# Patient Record
Sex: Male | Born: 1965 | Race: White | Hispanic: No | Marital: Married
Health system: Southern US, Community
[De-identification: ages and names within clinical notes are randomized; demographics above are authoritative.]

---

## 2011-07-05 ENCOUNTER — Emergency Department: Payer: Self-pay | Admitting: Emergency Medicine

## 2011-07-17 DEATH — deceased

## 2011-12-23 IMAGING — CT CT ABD-PELV W/ CM
1 of 3 series · 12 of 32 positions shown, 18 images · IV contrast (APPLIED)
Comparison: none

REASON FOR EXAM: (1) IV CONTRAST ONLY; GO W/O LABS; (2) UMBILICAL & BACK
PAIN, HYPOTENSIVE, EVAL
COMMENTS:   May transport without cardiac monitor

PROCEDURE:     CT  - CT ABDOMEN / PELVIS  W  - July 05, 2011 [DATE]
RESULT:
TECHNIQUE: Emergent CT of the abdomen and pelvis is reconstructed at 3 mm
slice thickness. The study is performed with 100 ml of Wsovue-WB1 iodinated
intravenous contrast. No oral contrast was administered.

[Series 4: aaa · axial · 0.79mm/px · z∈[+155,+605]mm · 12 of 176 slices shown, 18 images]
[im 13/176  soft-tissue]
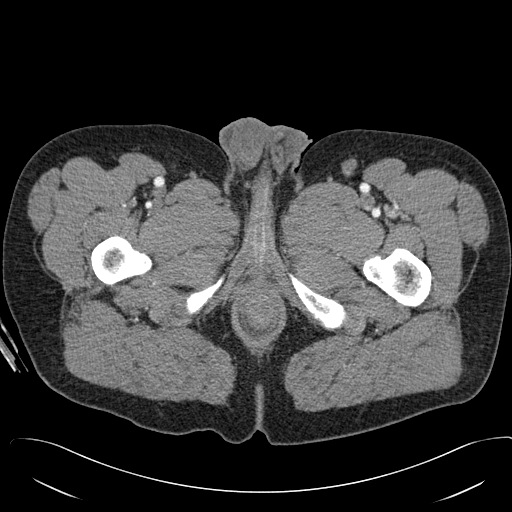
[im 13/176  bone]
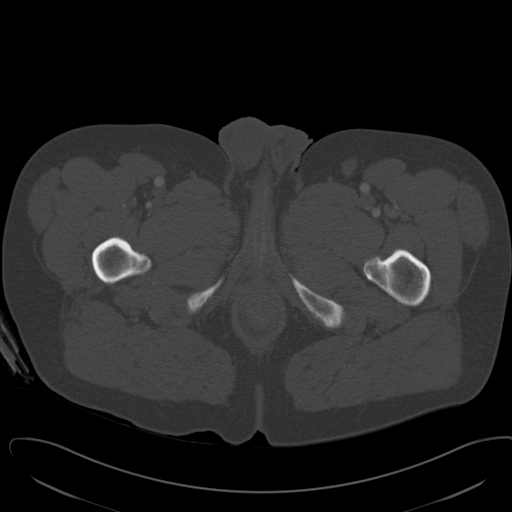
[im 26/176  soft-tissue]
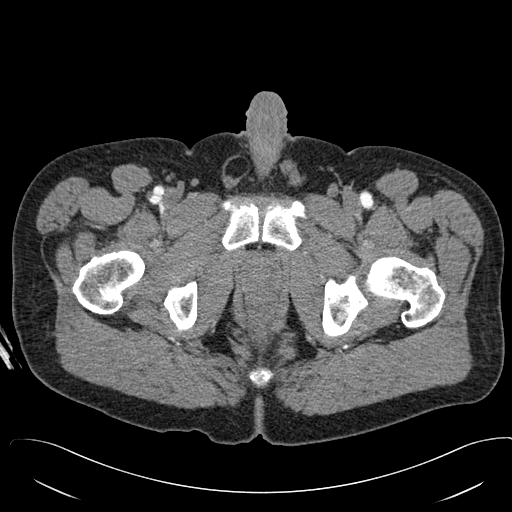
[im 38/176  soft-tissue]
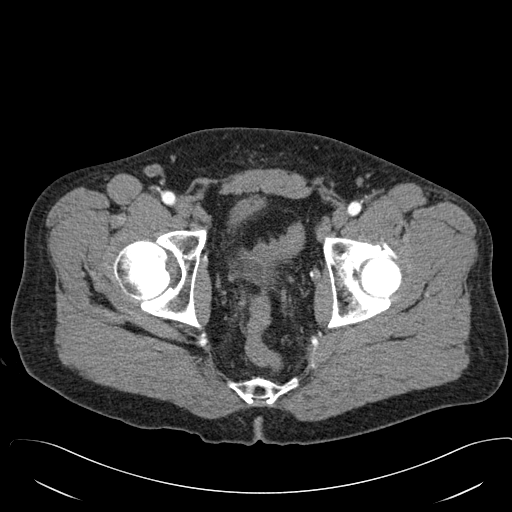
[im 51/176  soft-tissue]
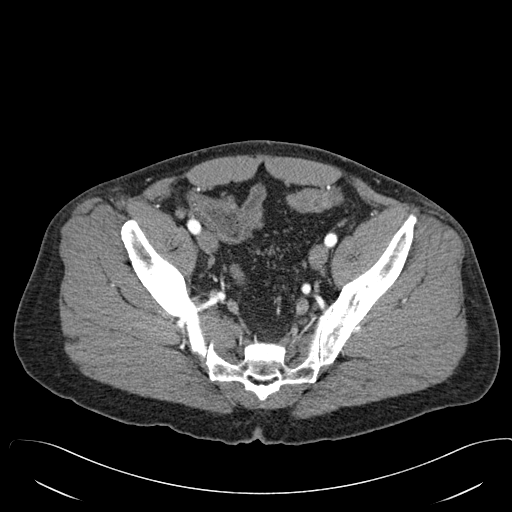
[im 63/176  soft-tissue]
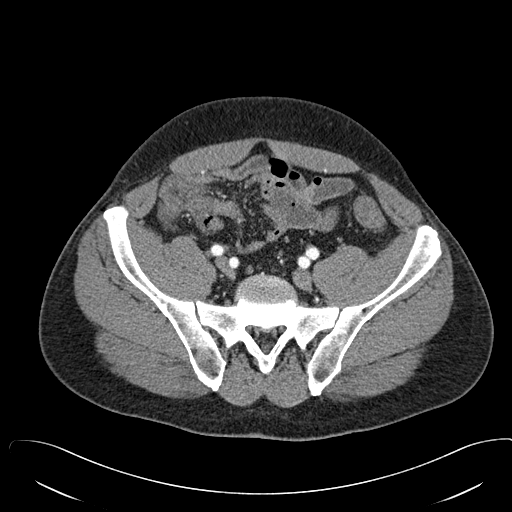
[im 76/176  soft-tissue]
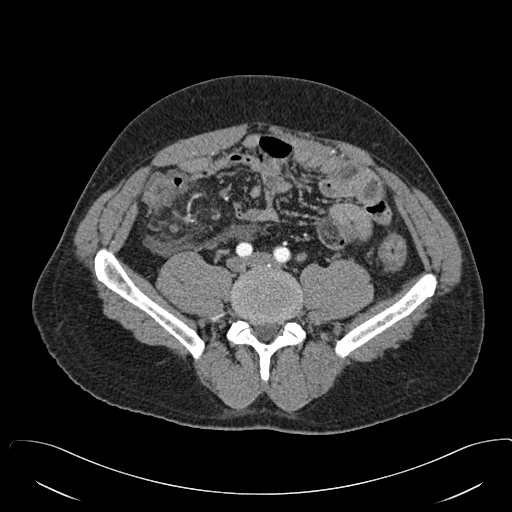
[im 101/176  soft-tissue]
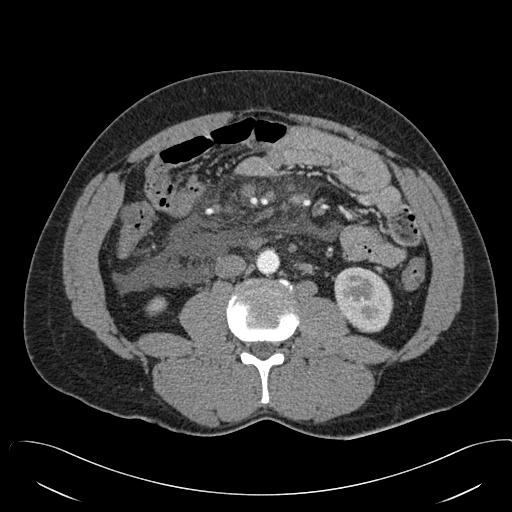
[im 113/176  soft-tissue]
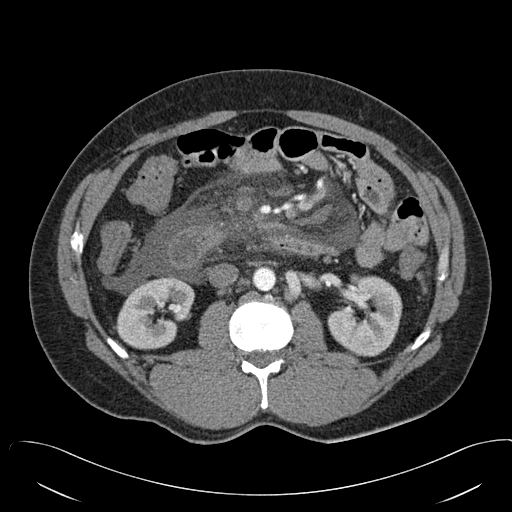
[im 126/176  soft-tissue]
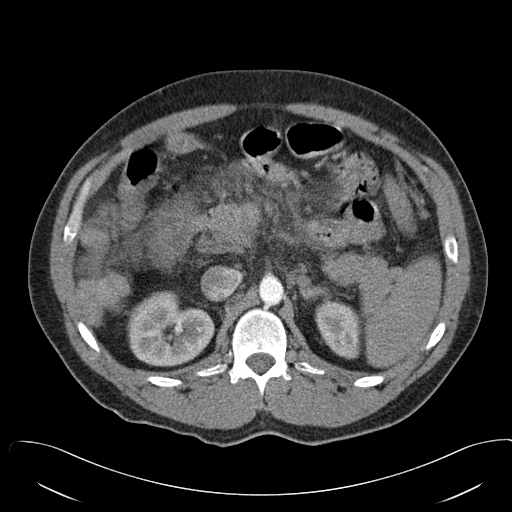
[im 126/176  lung]
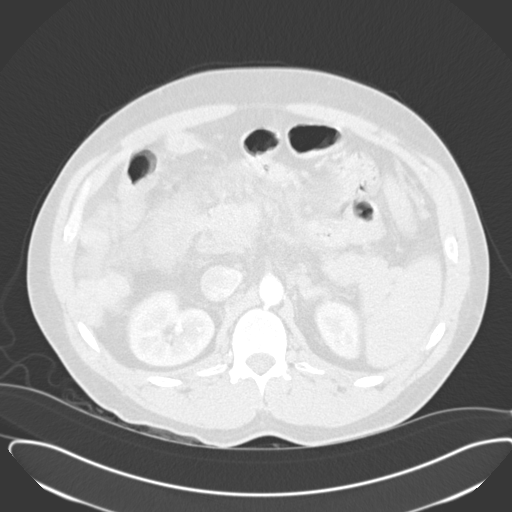
[im 126/176  bone]
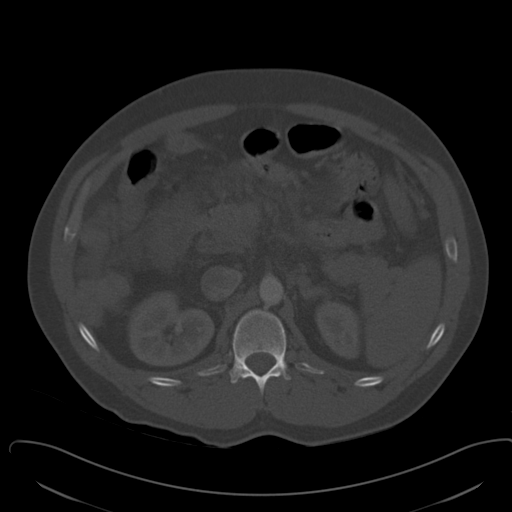
[im 138/176  soft-tissue]
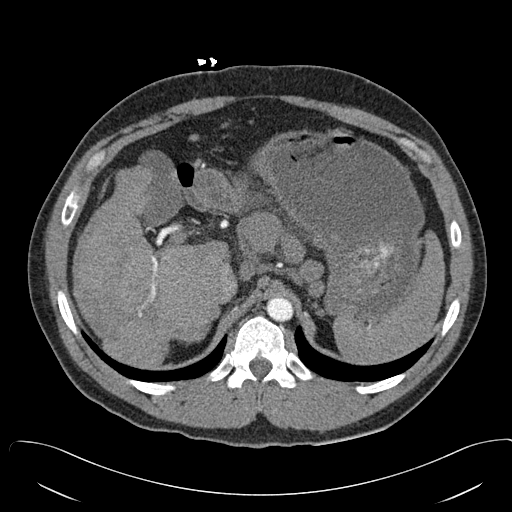
[im 138/176  lung]
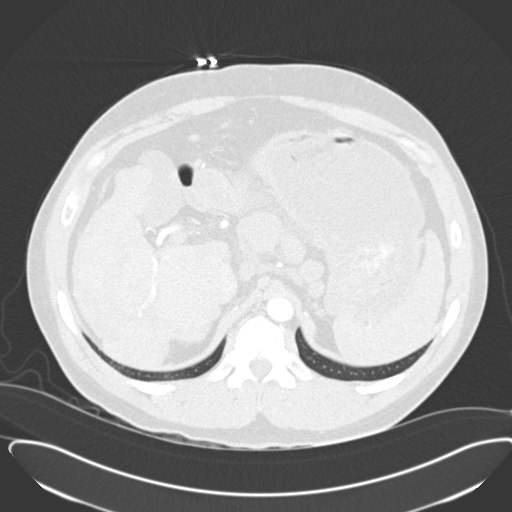
[im 151/176  soft-tissue]
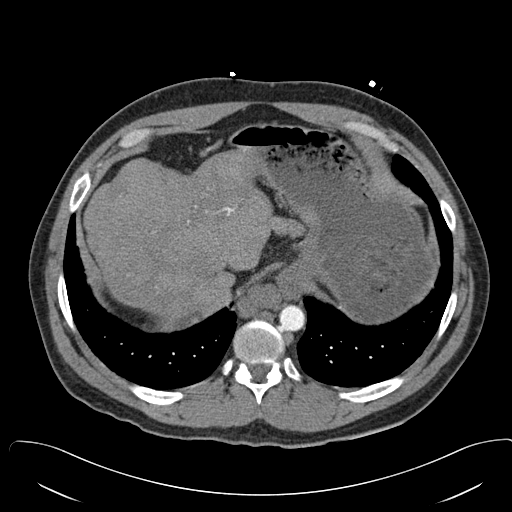
[im 151/176  lung]
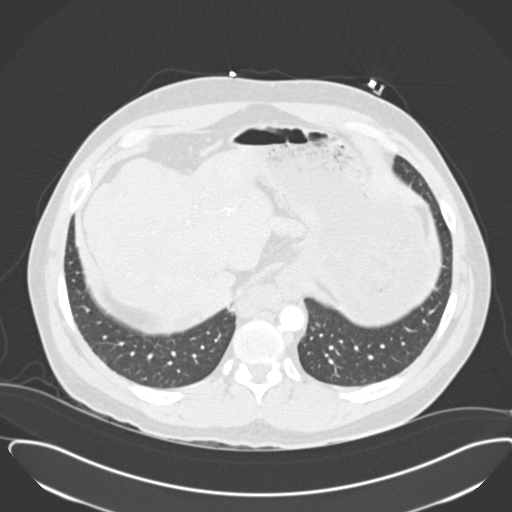
[im 163/176  soft-tissue]
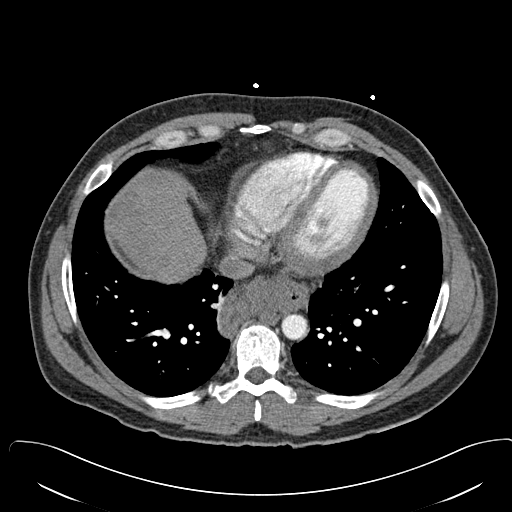
[im 163/176  lung]
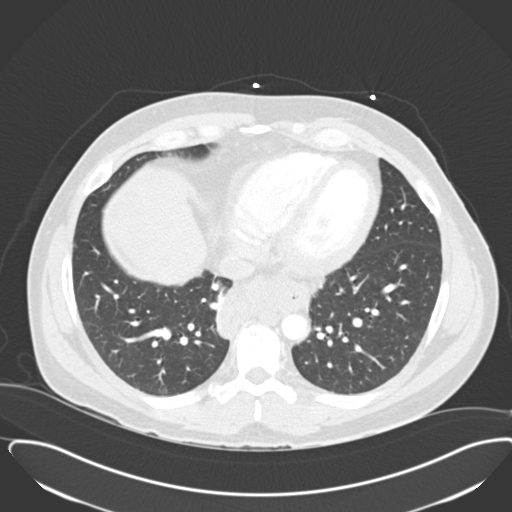

[12 of 32 positions shown; findings below may reference images not displayed]

FINDINGS: Extensive retroperitoneal peripancreatic increased density is seen
with central mesenteric edema present consistent with acute pancreatitis.
There appears to be enhancement of the entire pancreas without evidence of
an obvious cyst or pseudocyst. There is no evidence of aneurysmal dilation
of the aorta or its branches. The splenic vein and portal vein appear
patent. There is no abscess or other organized fluid collection. No biliary
or pancreatic ductal dilation is evident. The stranding extends into the
region of the duodenum and there may be some underlying duodenitis. There is
a moderately large amount of fluid and gastric contents remaining in the
stomach. The kidneys show no mass or obstruction. The liver and spleen
appear unremarkable. Scattered colonic diverticulosis is present without
evidence of acute diverticulitis. The liver shows some irregular contours.
The findings could be secondary to chronic liver disease possibly with
regenerative nodules. No abnormal enhancement is evident. Varices are
present suggesting the possibility of portal hypertension with portosystemic
shunting. The appendix is normal. The descending colon and transverse colon
are relatively collapsed. It is difficult to exclude wall thickening
especially in the descending colon. Correlate clinically for possible
colitis. This may be artifactual. No radiopaque gallstones are evident. The
lung bases appear clear.
IMPRESSION: 1.  Findings consistent with acute pancreatitis with extensive
retroperitoneal and peritoneal inflammatory stranding and some fluid
present. No definite cyst or pseudocyst or drainable fluid collection. There
may be secondary duodenitis.
2.  No abdominal aortic aneurysm or aortic dissection.
3.  Findings suggestive of advanced changes of cirrhosis with portal
hypertension and shunting.
4.  Possible colonic wall thickening versus artifact. This is in the
descending colon. Correlate for possible colitis.

## 2021-03-20 ENCOUNTER — Telehealth: Payer: Self-pay | Admitting: *Deleted

## 2021-03-20 NOTE — Telephone Encounter (Signed)
error
